# Patient Record
Sex: Male | Born: 1955 | Race: White | Hispanic: No | Marital: Single
Health system: Southern US, Community
[De-identification: ages and names within clinical notes are randomized; demographics above are authoritative.]

---

## 1997-07-27 ENCOUNTER — Other Ambulatory Visit: Admission: RE | Admit: 1997-07-27 | Discharge: 1997-07-27 | Payer: Self-pay | Admitting: *Deleted

## 1998-07-31 ENCOUNTER — Emergency Department (HOSPITAL_COMMUNITY): Admission: EM | Admit: 1998-07-31 | Discharge: 1998-07-31 | Payer: Self-pay | Admitting: Emergency Medicine

## 1998-08-01 ENCOUNTER — Encounter: Payer: Self-pay | Admitting: Emergency Medicine

## 2004-06-04 ENCOUNTER — Emergency Department (HOSPITAL_COMMUNITY): Admission: EM | Admit: 2004-06-04 | Discharge: 2004-06-05 | Payer: Self-pay | Admitting: Emergency Medicine

## 2007-06-29 ENCOUNTER — Ambulatory Visit: Payer: Self-pay | Admitting: Gastroenterology

## 2007-06-29 LAB — CONVERTED CEMR LAB
AST: 18 units/L (ref 0–37)
Alkaline Phosphatase: 61 units/L (ref 39–117)
Basophils Absolute: 0 10*3/uL (ref 0.0–0.1)
Basophils Relative: 0.3 % (ref 0.0–1.0)
Bilirubin, Direct: 0.1 mg/dL (ref 0.0–0.3)
Chloride: 102 meq/L (ref 96–112)
Ferritin: 280 ng/mL (ref 22.0–322.0)
GFR calc Af Amer: 115 mL/min
GFR calc non Af Amer: 95 mL/min
Glucose, Bld: 151 mg/dL — ABNORMAL HIGH (ref 70–99)
Hemoglobin: 16.6 g/dL (ref 13.0–17.0)
Lymphocytes Relative: 23 % (ref 12.0–46.0)
MCHC: 33.8 g/dL (ref 30.0–36.0)
Monocytes Relative: 9.2 % (ref 3.0–12.0)
Neutro Abs: 4.7 10*3/uL (ref 1.4–7.7)
Neutrophils Relative %: 66.2 % (ref 43.0–77.0)
Potassium: 4.2 meq/L (ref 3.5–5.1)
RBC: 5.46 M/uL (ref 4.22–5.81)
Saturation Ratios: 39.8 % (ref 20.0–50.0)
Sodium: 140 meq/L (ref 135–145)
WBC: 7 10*3/uL (ref 4.5–10.5)

## 2007-06-30 ENCOUNTER — Inpatient Hospital Stay (HOSPITAL_COMMUNITY): Admission: EM | Admit: 2007-06-30 | Discharge: 2007-07-03 | Payer: Self-pay | Admitting: Emergency Medicine

## 2007-06-30 ENCOUNTER — Encounter: Payer: Self-pay | Admitting: Gastroenterology

## 2007-06-30 ENCOUNTER — Ambulatory Visit: Payer: Self-pay | Admitting: Gastroenterology

## 2007-07-03 ENCOUNTER — Encounter: Payer: Self-pay | Admitting: Gastroenterology

## 2007-07-07 ENCOUNTER — Ambulatory Visit: Payer: Self-pay | Admitting: Gastroenterology

## 2008-11-06 ENCOUNTER — Encounter (INDEPENDENT_AMBULATORY_CARE_PROVIDER_SITE_OTHER): Payer: Self-pay | Admitting: *Deleted

## 2009-10-16 ENCOUNTER — Ambulatory Visit (HOSPITAL_COMMUNITY): Admission: RE | Admit: 2009-10-16 | Discharge: 2009-10-16 | Payer: Self-pay | Admitting: Internal Medicine

## 2010-07-30 NOTE — H&P (Signed)
Alexander [name], SLIKER NO.:  [id number]   MEDICAL RECORD NO.:  [id number]          PATIENT TYPE:  INP   LOCATION:  1528                         FACILITY:  [facility]   PHYSICIAN:  Malcolm T. Russella Dar, MD, FACGDATE OF BIRTH:  [birth year]   DATE OF ADMISSION:  06/30/2007  DATE OF DISCHARGE:                              HISTORY & PHYSICAL   GASTROINTESTINAL PHYSICIAN:  Dr. Sheryn Bison.   PRIMARY CARE PHYSICIAN:  Dr. Jarome Matin.   CHIEF COMPLAINT:  A 55 year old gentleman with hematemesis, epigastric  and back pain.   HISTORY OF PRESENT ILLNESS:  Alexander [name] is a pleasant 55 year old white  male who has mental retardation.  He has a history of gastroesophageal  reflux disease and had undergone esophageal dilatation back in 1990 by  Dr. Victorino Dike.  His family has noticed recent postprandial coughing,  and it has been getting worse.  He underwent an upper endoscopy with  [facility] dilatation of a Schatzki ring on the afternoon of April 15 by  Dr. Sheryn Bison.  The patient went home in good condition with no  complaints.  He went to sleep for about an 1-1/2 hours to 2 hours, and  his family heard him moaning.  The patient complained of epigastric pain  radiating into his back.  He quickly developed hematemesis of bright red  blood.  There was no chest pain, shortness of breath, fever/chills,  melena or hematochezia.  His last bowel movement had been in the  morning, prior to the endoscopy.  He had not eaten anything from the  time he was made n.p.o., as he had gone straight to bed after coming  home.  His parents brought him to the emergency room, where he was  evaluated by Dr. Claudette Head and admitted for care.  Preliminary  reading on a chest and abdominal series showed no free air or any acute  abnormality. His hemoglobin was 15.4 and his white count was elevated at  24.5.   PAST MEDICAL HISTORY:  1. Hypertension.  2. Diabetes mellitus type 2, controlled with  oral agents.  3. Mental retardation.  4. Gastroesophageal reflux disease with Schatzki ring, dilated in 1990      and this afternoon.  5. Dysphasia manifested as postprandial coughing.  6. Hyperlipidemia.  7. Constipation.  8. History of small colon polyps with latest colonoscopy in 2005.  9. History of kidney stones.  10.Status post remote tonsillectomy.  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   MEDICATIONS:  1. Actos 15 mg daily.  2. Clarinex 5 mg daily.  3. Clorazepate 3.75 mg daily.  4. Lovastatin 40 mg daily.  5. Triamterene 37.5 mg daily.  6. Vitamin C 500 mg daily.  7. Omeprazole 20 mg once daily.   ALLERGIES:  CODEINE, REACTION NOT KNOWN.   SOCIAL HISTORY:  The patient lives in [place] with his  parents.  He does not smoke or chew tobacco, does not consume alcoholic  beverages.   FAMILY HISTORY:  Notable for a brain tumor in his maternal grandmother,  diabetes mellitus type 2 in maternal grandfather.  His  mother has a left  bundle branch block and what sounds like a mitral valve prolapse.   REVIEW OF SYSTEMS:  Has been coughing postprandially, as noted above.  No urinary incontinence, no stool incontinence, no melena.  No sweats or  chills.  No difficulty walking, no falls.  No headaches.  No visual  problems.  No swelling of the extremities.  No rashes, no worrisome  lesions.  No inappropriate bleeding,  no nosebleeds.  No hemoptysis.  The patient's blood sugars at home tend to run anywhere from about 113  to the mid-130s.  He has not had any polydipsia or polyuria.   PHYSICAL EXAM:  GENERAL:  The patient is a well-developed, well-  nourished white male who is in no distress.  VITAL SIGNS:  Blood pressure 100/71, pulse 85, respirations 18,  temperature 98.7, weight is 83.2 kg.  HEENT:  The sclerae are nonicteric.  Conjunctiva is pink.  Extraocular  movements are intact.  Oropharynx is moist and clear.  Dentition in good  repair.  NECK:  Supple without  thyromegaly or adenopathy.  No bruits.  CHEST:  Clear to auscultation and percussion bilaterally.  CARDIOVASCULAR:  There is a regular rate and rhythm.  No murmurs, rubs  or gallops.  ABDOMEN:  Nondistended, soft with some mild diffuse tenderness to deep  palpation across the upper abdomen, but nonfocal.  Bowel sounds are  active.  No bruits.  No hepatosplenomegaly, no masses, no hernias.  EXTREMITIES:  No cyanosis, clubbing or edema.  NEUROLOGIC:  Alert and oriented x3.  Mild mental retardation but with  somewhat difficult to understand speech.  He follows commands easily.  No tremors.  DERMATOLOGIC:  No jaundice.  No pallor.  No spider angiomata.  GU:  Exam:  No scrotal edema.   LABORATORY:  White blood cell count 24.5.  Hemoglobin 15.4, hematocrit  46.2.  MCV 89.6.  Platelets 284.  PT 13.4, INR 1.0, PTT 23.  Sodium 138,  potassium 3.3.  Chloride 101, carbon dioxide 28.  Glucose 197.  BUN 18,  creatinine 1.04.  Total bilirubin 1.6, alkaline phosphatase 57, AST 21,  ALT 24.  Lipase is 17.   RADIOLOGY:  Acute abdominal series with chest film:  There is no acute  cardiopulmonary process, no free intraperitoneal air.  Diffuse mild  gaseous distention of the large and small bowel noted.   IMPRESSION:  1. Acute upper GI bleed with hematemesis and epigastric pain following      EGD with Maloney dilatation.  Suspect tear at the stricture or      gastric tear from the dilator tip or a Mallory Weiss tear.  The      patient currently stable.  2. History of esophageal stricture.  3. Hypokalemia.  4. Diabetes mellitus type 2 with current hyperglycemia.  5. Leukocytosis.  Likely this is reactive to current acute      hematemesis, but will need to be followed up.  Chest x-ray is      negative.   PLAN:  1. The patient admitted to [facility] inpatient GI service for close      monitoring.  At this point, he does not need telemetry.  2. We will plan to follow serial CBCs and transfuse if  indicated.  3. Follow blood sugars.  4. Add potassium to IV fluids.  5. Limit p.o. intake to ice chips and sips of clear liquids.  6. If the patient remains stable and has no recurrence of hematemesis  and blood counts did not the drop precipitously or he does not      become unstable, the patient may not require repeat upper      endoscopy, but if any or all of the above occur, we may need to      pursue upper endoscopy and hemostatic therapy.      Jennye Moccasin, PA-C      Venita Lick. Russella Dar, MD, [facility]  Electronically Signed    SG/MEDQ  D:  07/01/2007  T:  07/01/2007  Job:  [id number]

## 2010-07-30 NOTE — Assessment & Plan Note (Signed)
[facility]                                 ON-CALL NOTE   NAME:[name], Alexander FLAX                         MRN:          [id number]  DATE:06/30/2007                            DOB:          March 29, 1955    Telephone call  Phone number is [phone]  Time is 6:50 p.m.  Physician:  Dr. Sheryn Bison.   Alexander [name] is a 55 year old white male who underwent upper endoscopy with  dilation by Dr. Sheryn Bison this afternoon approximately 3 p.m.  His  mother calls this evening reporting epigastric pain radiating to the  back along with bright red blood emesis on several occasions over the  past hour.  No chest pain, melena, hematochezia, fevers or chills. I  advised the patient's mother to bring the patient to [facility] Emergency Room immediately.  The patient is to remain n.p.o.  The mother agrees and will bring the patient promptly to [facility] Emergency Room for further evaluation.     Venita Lick. Russella Dar, MD, [facility]  Electronically Signed    MTS/MedQ  DD: 06/30/2007  DT: 06/30/2007  Job #: [id number]   cc:   Vania Rea. Jarold Motto, MD, [facility] Essex, FAGA

## 2010-07-30 NOTE — Assessment & Plan Note (Signed)
[facility]                         GASTROENTEROLOGY OFFICE NOTE   NAME:Lakey, WAKE CONLEE                         MRN:          [id number]  DATE:06/29/2007                            DOB:          December 20, 1955    Mr. Bochenek is a 55 year old handicapped male taken care of by his parents.  He is brought in today because of postprandial dysphagia and coughing.  He has a long past history of acid reflux, as been on chronic PPI  therapy.  Last endoscopy was some 13 years ago at which time he had a  Schatzki's ring that was dilated by Dr. Victorino Dike.  He has done well  since that time and denies worsening reflux symptoms, but does over the  last month have increasing coughing postprandially.  I have also seen  him in the past because of chronic constipation and he has had some  small colon polyps removed; last colonoscopy in 2005.  The patient  apparently had an episode of abdominal pain in March of 2006, had CT  scan of the abdomen which was entirely unremarkable except for large  amount of fecal material in his colon.  He is followed by Dr. Jarome Matin and has adult-onset diabetes, hypertension, and hyperlipidemia.   The family relates that Demarr eats well and most of his coughing occurs  if he over eats.  He has had no anorexia, weight loss, nausea, vomiting,  melena, hematochezia, specific hepatobiliary complaints.   His diabetes is well-controlled and recent hemoglobin A1C was checked by  Dr. Jarome Matin and was 6.5%.  I do not have other recent laboratory  data.   Patient also has hypertension, hyperlipidemia and has no history of MIs  or CVAs.  From what I can ascertain, he has not had previous abdominal  surgery.  He does have recurrent kidney stones, had a previous  tonsillectomy.   FAMILY HISTORY:  Noncontributory.   MEDICATIONS:  1. Triamterene/hydrochlorothiazide 37.5 - 25 mg a day.  2. Clorazepate 3.7 mg daily.  3. Omeprazole 20 mg a  day.  4. Actos 15 mg a day.  5. Lovastatin 40 mg a day.  6. Vitamin C daily.   He denies true drug allergies.   SOCIAL HISTORY:  He is single, lives with his parents and has apparently  a 10th grade education but is mentally retarded.  Does not smoke or use  ethanol.   REVIEW OF SYSTEMS:  Otherwise noncontributory.   PHYSICAL EXAMINATION:  GENERAL:  He is awake and alert in no acute  distress.  VITAL SIGNS: His height is 5 feet 8 inches tall and weighs 188 pounds.  Blood pressure is 120/76 and pulse was 80 and regular.  I could not  appreciate stigmata of chronic liver disease or thyromegaly.  CHEST:  Clear.  CARDIAC:  Regular rhythm without murmurs, gallops or rubs.  ABDOMEN:  No distention, organomegaly, masses or tenderness.  Bowel  sounds were nonobstructive.  EXTREMITIES:  Upper extremities were unremarkable.  Patient was oriented but was not very conversive.  Full mental status  was not performed.  ASSESSMENT:  1. Chronic gastroesophageal reflux disease with a history of      Schatzki's ring to the esophagus.  Reviewing Dr. Blossom Hoops note      from years ago, this current episode sounds almost exactly like      that clinic visit when he had his Schatzki's ring dilated.  2. Chronic functional constipation.  3. Well-controlled adult-onset diabetes.  4. Well-controlled essential hypertension, hyperlipidemia.  5. Mild mental retardation.   RECOMMENDATION:  1. Reflux regime reviewed with parents and will change to Nexium 40 mg      before breakfast and supper.  2. Outpatient endoscopy of esophageal dilatation.  3. Consider other multiple medication per Dr. Jarome Matin.   Biopsy of the patient's rectum in 2005 actually showed mucosal prolapse  syndrome and no adenomatous polyps.     Vania Rea. Jarold Motto, MD, [facility], FAGA  Electronically Signed    DRP/MedQ  DD: 06/29/2007  DT: 06/29/2007  Job #: [id number]   cc:   Barry Dienes. Eloise Harman, M.D.

## 2010-08-02 NOTE — Discharge Summary (Signed)
NAMESHAVAR, GORKA NO.:  [id number]   MEDICAL RECORD NO.:  [id number]          PATIENT TYPE:  INP   LOCATION:  1528                         FACILITY:  [facility]   PHYSICIAN:  Hedwig Morton. Juanda Chance, MD     DATE OF BIRTH:  10-01-1955   DATE OF ADMISSION:  06/30/2007  DATE OF DISCHARGE:  07/03/2007                               DISCHARGE SUMMARY   ADMITTING DIAGNOSES:  46. A 55 year old male with hematemesis and epigastric pain, status      post EGD and Maloney dilation, suspect esophageal tear or gastric      tear post dilation.  2. History of esophageal stricture.  3. Hypokalemia.  4. Adult-onset diabetes mellitus.  5. Leukocytosis likely secondary to #1.  6. Mental retardation.   DISCHARGE DIAGNOSES:  1. Stable status post upper gastrointestinal bleed secondary to      esophageal dilation with no evidence for tear or leak on upper GI,      no transfusion requirement.  2. Other diagnoses as listed above.   CONSULTATIONS:  None.   PROCEDURES:  Upper gastrointestinal and plain abdominal films.   BRIEF HISTORY:  Christina is a 55 year old white male with history of mental  retardation.  He has a history of GERD and has undergone previous  esophageal dilations by Dr. Terrial Rhodes.  Recently he had been noted  to have some postprandial coughing which had been progressing.  He  underwent EGD with Elease Hashimoto dilation of a Schatzki's ring on June 30, 2007 per Dr. Sheryn Bison.  He did well with the procedure and was  discharged without complaints.  When he awakened apparently he was  moaning.  The patient complained of some epigastric pain radiating into  his back, and then vomited some bright red blood.  He had no complaints  of chest pain or shortness of breath.  He had not eaten anything from  the time that he had had the procedure.  He was brought to the emergency  room by his parents, evaluated by Dr. Russella Dar, and admitted for  stabilization and further diagnostic  evaluation.  There was no evidence  of free air.  On plain abdominal films hemoglobin was 15.4 at the time  of presentation.   LABORATORY STUDIES:  On April 15 it showed a white count of 24.5,  hemoglobin 15.4, hematocrit of 46.2, MCV of 89, and platelets 284.  Serial values were obtained and on the 16th WBC was 12.4, hemoglobin  12.6, hematocrit of 37.3.  On the 18th WBC of 11.9, hemoglobin 11.7,  hematocrit of 34.5.  ProTime 13.4, INR of 1.0, PTT of 23, potassium was  3.3 on admission, this was corrected.  BUN 18, creatinine 1.04 on  admission.  Liver function studies normal with the exception of a total  bilirubin at 1.6.   X-RAY STUDIES:  Chest x-ray on 04/16 was negative.  Upper GI on 04/17 as  outlined above.  No acute findings, somewhat patulous esophagus,  specifically no evidence for stricture or leak   HOSPITAL COURSE:  The patient was admitted to the  service of Dr. Claudette Head who was covering the hospital.  He was initially placed on IV  fluids, kept n.p.o., started on a Protonix infusion, and serial H&H.  He  was followed then by Dr. Lina Sar, remained hemodynamically stable,  did not require any transfusions, and did not manifest any further  evidence of active bleeding.  He did have a low-grade temperature on the  16th.  Chest x-ray was obtained and this was negative.  He underwent  upper GI on the 17th which was also a negative exam specifically without  evidence of leak or stricture.  His diet was advanced, and he was  allowed discharge to home on 04/18 with instructions to follow up with  Dr. Jarold Motto, in the office, and to call if any problems in the  interim.   MEDICATIONS:  He was to continue his:  1. Actos 15 mg daily.  2. Clarinex 5 mg as needed.  3. Clorazepate 3.75 daily  4. Lovastatin 40 daily.  5. Triamterene 37.5 daily.  6. Omeprazole 20 mg daily.      Amy Armour, PA-C      Dora M. Juanda Chance, MD  Electronically Signed    AE/MEDQ  D:   07/26/2007  T:  07/26/2007  Job:  [id number]   cc:   Vania Rea. Jarold Motto, MD, FACG, FACP, FAGA  [name]  [place]  [place] [address]

## 2010-12-10 LAB — CBC
HCT: 34.5 — ABNORMAL LOW
HCT: 37.3 — ABNORMAL LOW
HCT: 46.2
Hemoglobin: 12.6 — ABNORMAL LOW
Hemoglobin: 15.4
MCHC: 33.4
MCHC: 33.5
MCHC: 33.9
MCV: 88.7
MCV: 89.6
Platelets: 204
Platelets: 216
Platelets: 284
RBC: 5.15
RDW: 13
RDW: 13
RDW: 13.1
RDW: 13.1
WBC: 24.5 — ABNORMAL HIGH

## 2010-12-10 LAB — DIFFERENTIAL
Basophils Absolute: 0.2 — ABNORMAL HIGH
Basophils Relative: 1
Eosinophils Absolute: 0
Eosinophils Relative: 0
Lymphocytes Relative: 10 — ABNORMAL LOW
Lymphs Abs: 2.5
Monocytes Absolute: 1.4 — ABNORMAL HIGH
Monocytes Relative: 6
Neutro Abs: 20.4 — ABNORMAL HIGH
Neutrophils Relative %: 83 — ABNORMAL HIGH

## 2010-12-10 LAB — COMPREHENSIVE METABOLIC PANEL
ALT: 18
ALT: 24
AST: 18
AST: 21
Alkaline Phosphatase: 57
CO2: 28
Calcium: 7.9 — ABNORMAL LOW
Chloride: 101
GFR calc Af Amer: >60
GFR calc Af Amer: >60
GFR calc non Af Amer: >60
Potassium: 3.3 — ABNORMAL LOW
Sodium: 138
Sodium: 138
Total Bilirubin: 1.6 — ABNORMAL HIGH
Total Protein: 5.5 — ABNORMAL LOW

## 2010-12-10 LAB — LIPASE, BLOOD: Lipase: 17

## 2010-12-10 LAB — COMPREHENSIVE METABOLIC PANEL WITH GFR
Albumin: 3.8
BUN: 18
Calcium: 8.7
Creatinine, Ser: 1.04
Glucose, Bld: 197 — ABNORMAL HIGH
Total Protein: 6.7

## 2010-12-10 LAB — CROSSMATCH: Antibody Screen: NEGATIVE

## 2010-12-10 LAB — SAMPLE TO BLOOD BANK

## 2010-12-10 LAB — HEMOGLOBIN AND HEMATOCRIT, BLOOD: Hemoglobin: 12.1 — ABNORMAL LOW

## 2010-12-10 LAB — PROTIME-INR
INR: 1
Prothrombin Time: 13.4

## 2010-12-10 LAB — APTT: aPTT: 23 — ABNORMAL LOW

## 2012-07-01 ENCOUNTER — Other Ambulatory Visit: Payer: Self-pay | Admitting: Dermatology

## 2012-07-07 ENCOUNTER — Other Ambulatory Visit: Payer: Self-pay | Admitting: Dermatology

## 2012-11-04 ENCOUNTER — Other Ambulatory Visit: Payer: Self-pay | Admitting: Gastroenterology

## 2012-11-05 NOTE — Telephone Encounter (Signed)
Patient has wrong doctor

## 2013-10-14 ENCOUNTER — Encounter: Payer: Self-pay | Admitting: Gastroenterology

## 2014-06-12 ENCOUNTER — Encounter: Payer: Self-pay | Admitting: Gastroenterology

## 2014-07-21 ENCOUNTER — Encounter: Payer: Self-pay | Admitting: Gastroenterology

## 2019-12-13 ENCOUNTER — Ambulatory Visit (HOSPITAL_COMMUNITY)
Admission: EM | Admit: 2019-12-13 | Discharge: 2019-12-13 | Disposition: A | Discharge status: Home / Self Care | Payer: Medicare Other

## 2019-12-13 ENCOUNTER — Other Ambulatory Visit: Payer: Self-pay

## 2019-12-13 NOTE — ED Notes (Signed)
Patient is being discharged from the Urgent Care and sent to the Emergency Department via private vehicle . Per Patterson Hammersmith, PA, patient is in need of higher level of care due to open fracture / wound requiring suturing. Patient is aware and verbalizes understanding of plan of care. There were no vitals filed for this visit.

## 2019-12-13 NOTE — ED Notes (Signed)
Have paperwork at nurses desk.  Called [facility].  Someone at practice had told patient that they did not have a provider to sew wound.  Patient was sent to [place] urgent care to be seen for suturing

## 2019-12-13 NOTE — ED Notes (Signed)
Spoke to patient and family member. Spoke to [employer], pa about patient.  Patient to go to ED for evaluation.

## 2020-05-03 ENCOUNTER — Emergency Department (HOSPITAL_COMMUNITY): Payer: Medicare Other

## 2020-05-03 ENCOUNTER — Other Ambulatory Visit: Payer: Self-pay

## 2020-05-03 ENCOUNTER — Emergency Department (HOSPITAL_COMMUNITY)
Admission: EM | Admit: 2020-05-03 | Discharge: 2020-05-04 | Disposition: A | Discharge status: Home / Self Care | Payer: Medicare Other | Attending: Emergency Medicine | Admitting: Emergency Medicine

## 2020-05-03 ENCOUNTER — Encounter (HOSPITAL_COMMUNITY): Payer: Self-pay | Admitting: Emergency Medicine

## 2020-05-03 DIAGNOSIS — S0990XA Unspecified injury of head, initial encounter: Secondary | ICD-10-CM | POA: Diagnosis present

## 2020-05-03 DIAGNOSIS — S0003XA Contusion of scalp, initial encounter: Secondary | ICD-10-CM | POA: Insufficient documentation

## 2020-05-03 DIAGNOSIS — W108XXA Fall (on) (from) other stairs and steps, initial encounter: Secondary | ICD-10-CM | POA: Diagnosis not present

## 2020-05-03 DIAGNOSIS — Z23 Encounter for immunization: Secondary | ICD-10-CM | POA: Insufficient documentation

## 2020-05-03 DIAGNOSIS — R1084 Generalized abdominal pain: Secondary | ICD-10-CM | POA: Insufficient documentation

## 2020-05-03 DIAGNOSIS — W19XXXA Unspecified fall, initial encounter: Secondary | ICD-10-CM

## 2020-05-03 LAB — COMPREHENSIVE METABOLIC PANEL
ALT: 15 U/L (ref 0–44)
AST: 22 U/L (ref 15–41)
Albumin: 3.8 g/dL (ref 3.5–5.0)
Alkaline Phosphatase: 53 U/L (ref 38–126)
Anion gap: 11 (ref 5–15)
BUN: 13 mg/dL (ref 8–23)
CO2: 24 mmol/L (ref 22–32)
Calcium: 8.9 mg/dL (ref 8.9–10.3)
Chloride: 101 mmol/L (ref 98–111)
Creatinine, Ser: 1.01 mg/dL (ref 0.61–1.24)
GFR, Estimated: >60 mL/min (ref >60)
Glucose, Bld: 174 mg/dL — ABNORMAL HIGH (ref 70–99)
Potassium: 3.4 mmol/L — ABNORMAL LOW (ref 3.5–5.1)
Sodium: 136 mmol/L (ref 135–145)
Total Bilirubin: 1 mg/dL (ref 0.3–1.2)
Total Protein: 7 g/dL (ref 6.5–8.1)

## 2020-05-03 LAB — CBC WITH DIFFERENTIAL/PLATELET
Abs Immature Granulocytes: 0.05 10*3/uL (ref 0.00–0.07)
Basophils Absolute: 0 10*3/uL (ref 0.0–0.1)
Basophils Relative: 0 %
Eosinophils Absolute: 0 10*3/uL (ref 0.0–0.5)
Eosinophils Relative: 0 %
HCT: 45.3 % (ref 39.0–52.0)
Hemoglobin: 14.9 g/dL (ref 13.0–17.0)
Immature Granulocytes: 1 %
Lymphocytes Relative: 9 %
Lymphs Abs: 0.9 10*3/uL (ref 0.7–4.0)
MCH: 30.1 pg (ref 26.0–34.0)
MCHC: 32.9 g/dL (ref 30.0–36.0)
MCV: 91.5 fL (ref 80.0–100.0)
Monocytes Absolute: 0.5 10*3/uL (ref 0.1–1.0)
Monocytes Relative: 5 %
Neutro Abs: 9 10*3/uL — ABNORMAL HIGH (ref 1.7–7.7)
Neutrophils Relative %: 85 %
Platelets: 216 10*3/uL (ref 150–400)
RBC: 4.95 MIL/uL (ref 4.22–5.81)
RDW: 12.8 % (ref 11.5–15.5)
WBC: 10.5 10*3/uL (ref 4.0–10.5)
nRBC: 0 % (ref 0.0–0.2)

## 2020-05-03 LAB — LIPASE, BLOOD: Lipase: 23 U/L (ref 11–51)

## 2020-05-03 MED ORDER — IOHEXOL 300 MG/ML  SOLN
100.0000 mL | Freq: Once | INTRAMUSCULAR | Status: AC | PRN
  Administered 2020-05-03: 100 mL via INTRAVENOUS

## 2020-05-03 MED ORDER — TETANUS-DIPHTH-ACELL PERTUSSIS 5-2.5-18.5 LF-MCG/0.5 IM SUSY
0.5000 mL | PREFILLED_SYRINGE | Freq: Once | INTRAMUSCULAR | Status: AC
  Administered 2020-05-03: 0.5 mL via INTRAMUSCULAR
  Filled 2020-05-03: qty 0.5

## 2020-05-03 NOTE — ED Triage Notes (Addendum)
Patient tripped and fell down thirteen steps. No LOC. Hematoma to left forehead. Multiple abrasions on face and chest. Denies neck pain, dizziness.

## 2020-05-03 NOTE — ED Notes (Signed)
Patient transported to CT 

## 2020-05-03 NOTE — ED Provider Notes (Signed)
Care assumed from [facility], please see her note for full details, but in brief Alexander [name] is a 65 y.o. male presents after he fell backwards down a flight of stairs hitting his head, he has a hematoma to the head but no other obvious signs of trauma.  No loss of consciousness or focal neuro deficits.  Lab work, CT of the head and cervical spine and x-rays of the chest and pelvis have all been reassuring but patient did have some abdominal tenderness, CT abdomen pelvis pending, no significant tenderness over the chest, did not feel that CT chest was warranted.  Plan: f/u CT, if normal can go home  BP 132/78   Pulse 72   Temp 98.2 F (36.8 C) (Oral)   Resp 18   SpO2 100%   ED Course/Procedures   Labs Reviewed  COMPREHENSIVE METABOLIC PANEL - Abnormal; Notable for the following components:      Result Value   Potassium 3.4 (*)    Glucose, Bld 174 (*)    All other components within normal limits  CBC WITH DIFFERENTIAL/PLATELET - Abnormal; Notable for the following components:   Neutro Abs 9.0 (*)    All other components within normal limits  LIPASE, BLOOD   CT Head Wo Contrast  Result Date: 05/03/2020 CLINICAL DATA:  Fall down stairs EXAM: CT HEAD WITHOUT CONTRAST CT CERVICAL SPINE WITHOUT CONTRAST TECHNIQUE: Multidetector CT imaging of the head and cervical spine was performed following the standard protocol without intravenous contrast. Multiplanar CT image reconstructions of the cervical spine were also generated. COMPARISON:  None. FINDINGS: CT HEAD FINDINGS Brain: There is no mass, hemorrhage or extra-axial collection. The size and configuration of the ventricles and extra-axial CSF spaces are normal. The brain parenchyma is normal, without evidence of acute or chronic infarction. Vascular: No abnormal hyperdensity of the major intracranial arteries or dural venous sinuses. No intracranial atherosclerosis. Skull: Left frontal scalp hematoma.  No skull fracture. Sinuses/Orbits:  Right frontal sinus osteoma.  The orbits are normal. CT CERVICAL SPINE FINDINGS Alignment: No static subluxation. Facets are aligned. Occipital condyles are normally positioned. Skull base and vertebrae: No acute fracture. Soft tissues and spinal canal: No prevertebral fluid or swelling. No visible canal hematoma. Disc levels: Multilevel cervical degenerative disc disease with some degree of spinal canal narrowing at C4-5, C5-6 and T1-2. Upper chest: No pneumothorax, pulmonary nodule or pleural effusion. Other: Normal visualized paraspinal cervical soft tissues. IMPRESSION: 1. No acute intracranial abnormality. 2. Left frontal scalp hematoma without skull fracture. 3. No acute fracture or static subluxation of the cervical spine. Electronically Signed   By: Deatra Robinson M.D.   On: 05/03/2020 22:12   CT Cervical Spine Wo Contrast  Result Date: 05/03/2020 CLINICAL DATA:  Fall down stairs EXAM: CT HEAD WITHOUT CONTRAST CT CERVICAL SPINE WITHOUT CONTRAST TECHNIQUE: Multidetector CT imaging of the head and cervical spine was performed following the standard protocol without intravenous contrast. Multiplanar CT image reconstructions of the cervical spine were also generated. COMPARISON:  None. FINDINGS: CT HEAD FINDINGS Brain: There is no mass, hemorrhage or extra-axial collection. The size and configuration of the ventricles and extra-axial CSF spaces are normal. The brain parenchyma is normal, without evidence of acute or chronic infarction. Vascular: No abnormal hyperdensity of the major intracranial arteries or dural venous sinuses. No intracranial atherosclerosis. Skull: Left frontal scalp hematoma.  No skull fracture. Sinuses/Orbits: Right frontal sinus osteoma.  The orbits are normal. CT CERVICAL SPINE FINDINGS Alignment: No static subluxation. Facets  are aligned. Occipital condyles are normally positioned. Skull base and vertebrae: No acute fracture. Soft tissues and spinal canal: No prevertebral fluid or  swelling. No visible canal hematoma. Disc levels: Multilevel cervical degenerative disc disease with some degree of spinal canal narrowing at C4-5, C5-6 and T1-2. Upper chest: No pneumothorax, pulmonary nodule or pleural effusion. Other: Normal visualized paraspinal cervical soft tissues. IMPRESSION: 1. No acute intracranial abnormality. 2. Left frontal scalp hematoma without skull fracture. 3. No acute fracture or static subluxation of the cervical spine. Electronically Signed   By: Deatra Robinson M.D.   On: 05/03/2020 22:12   CT Abdomen Pelvis W Contrast  Result Date: 05/03/2020 CLINICAL DATA:  Abdominal pain, fell downstairs, multiple abrasions EXAM: CT ABDOMEN AND PELVIS WITH CONTRAST TECHNIQUE: Multidetector CT imaging of the abdomen and pelvis was performed using the standard protocol following bolus administration of intravenous contrast. CONTRAST:  OMNIPAQUE IOHEXOL 300 MG/ML  SOLN COMPARISON:  06/05/2004 FINDINGS: Lower chest: No acute pleural or parenchymal lung disease. Hepatobiliary: No hepatic injury or perihepatic hematoma. Gallbladder is unremarkable. Small hepatic cysts are unchanged. Pancreas: Unremarkable. No pancreatic ductal dilatation or surrounding inflammatory changes. Spleen: No splenic injury or perisplenic hematoma. Adrenals/Urinary Tract: Adrenal glands are unremarkable. Kidneys are normal, without renal calculi, focal lesion, or hydronephrosis. Bladder is unremarkable. Stomach/Bowel: No bowel obstruction or ileus. Normal appendix right lower quadrant. No bowel wall thickening or inflammatory change. Vascular/Lymphatic: Aortic atherosclerosis. No enlarged abdominal or pelvic lymph nodes. Reproductive: Prostate is unremarkable. Other: No free fluid or free gas.  No abdominal wall hernia. Musculoskeletal: No acute or destructive bony lesions. Reconstructed images demonstrate no additional findings. IMPRESSION: 1. No acute intra-abdominal or intrapelvic process. Electronically Signed    By: Sharlet Salina M.D.   On: 05/03/2020 23:59   DG Pelvis Portable  Result Date: 05/03/2020 CLINICAL DATA:  Bilateral hip pain after fall EXAM: PORTABLE PELVIS 1-2 VIEWS COMPARISON:  None. FINDINGS: Single frontal view of the pelvis including both hips was performed. No acute displaced fracture. Joint spaces are well preserved. Sacroiliac joints are normal. IMPRESSION: 1. Unremarkable pelvis and bilateral hips. Electronically Signed   By: Sharlet Salina M.D.   On: 05/03/2020 21:58   DG Chest Portable 1 View  Result Date: 05/03/2020 CLINICAL DATA:  Bilateral rib pain after fall EXAM: PORTABLE CHEST 1 VIEW COMPARISON:  07/01/2007 FINDINGS: Single frontal view of the chest demonstrates an unremarkable cardiac silhouette. No airspace disease, effusion, or pneumothorax. No acute displaced fracture. IMPRESSION: 1. No acute intrathoracic process. Electronically Signed   By: Sharlet Salina M.D.   On: 05/03/2020 21:57     Procedures  MDM   CT abdomen pelvis reassuring with no acute intra-abdominal or intrapelvic process.  Patient's work-up has been very reassuring and he is stable for discharge home I discussed this plan with patient and his father at bedside they expressed understanding and agreement and are eager to get home.  Discharged home in good condition.       Dartha Lodge, PA-C 05/04/20 0030    Zadie Rhine, MD 05/04/20 [phone]

## 2020-05-03 NOTE — ED Provider Notes (Signed)
[facility] Note   CSN: [id number] Arrival date & time: 05/03/20  1734     History Chief Complaint  Patient presents with  . Fall    Alexander PeachJames K Schley is a 65 y.o. male who presents to the ED today s/p fall down 13 stairs that occurred around 4 PM today. Father is with pt at bedside - reports pt was at the top of the stairs where a door is; he believes pt likely tripped and lost his footing at the top of the stairs causing him to fall backwards down the entire 13 steps hitting his head at the bottom of the stairs on the floor; no LOC. Dad heard the commotion and went to find patient at the bottom of the staircase. Pt was able to stand afterwards and came by POV. Father does not believe pt is anticoagulated. Pt denies nausea, vomiting, blurry vision, or double vision. Dad states pt is acting at baseline. Pt also currently complaining of abdominal pain and bilateral rib pain/bilateral hip pain. He been able to ambulate without difficulty. No other complaints at this time.   The history is provided by the patient, medical records and a parent. The history is limited by a developmental delay.      History reviewed. No pertinent past medical history.  There are no problems to display for this patient.  No family history on file.     Home Medications Prior to Admission medications   Medication Sig Start Date End Date Taking? Authorizing Provider  atenolol (TENORMIN) 25 MG tablet Take 25 mg by mouth daily. 04/07/20  Yes [provider]  lovastatin (MEVACOR) 40 MG tablet Take 40 mg by mouth daily.   Yes [provider]  omeprazole (PRILOSEC) 40 MG capsule Take 40 mg by mouth daily.   Yes [provider]  pioglitazone (ACTOS) 15 MG tablet Take 15 mg by mouth daily. 04/09/20  Yes [provider]  triamterene-hydrochlorothiazide (MAXZIDE-25) 37.5-25 MG tablet Take 1 tablet by mouth daily.   Yes [provider]    Allergies    Patient has no known allergies.  Review of Systems   Review of Systems  Constitutional: Negative for chills and fever.  Gastrointestinal: Positive for abdominal pain. Negative for nausea and vomiting.  Musculoskeletal: Positive for arthralgias.  Neurological: Positive for headaches. Negative for syncope.  All other systems reviewed and are negative.   Physical Exam Updated Vital Signs BP (!) 141/77   Pulse 79   Temp 98.2 F (36.8 C) (Oral)   Resp 17   SpO2 100%   Physical Exam Vitals and nursing note reviewed.  Constitutional:      Appearance: He is not ill-appearing.  HENT:     Head: Normocephalic.     Comments: Large hematoma to left frontal area with abrasion with small amount of active bleeding Eyes:     Extraocular Movements: Extraocular movements intact.     Conjunctiva/sclera: Conjunctivae normal.     Pupils: Pupils are equal, round, and reactive to light.  Neck:     Comments: Mild C midline spinal TTP with surrounding right paracervical musculature TTP; small abrasion noted to posterior right neck without hematoma appreciated Cardiovascular:     Rate and Rhythm: Normal rate and regular rhythm.  Pulmonary:     Effort: Pulmonary effort is normal.     Breath sounds: Normal breath sounds. No wheezing, rhonchi or rales.     Comments: + lateral rib TTP bilaterally  without crepitus. Speaking in full sentences without difficulty. Satting 100% on RA. Chest:     Chest wall: Tenderness present.  Abdominal:     Palpations: Abdomen is soft.     Tenderness: There is abdominal tenderness.     Comments: Abrasion noted to epigastrium; no obvious TTP Diffuse abdominal TTP on exam; bowel sounds equal; no rebound or guarding  Musculoskeletal:     Cervical back: Neck supple.     Comments: No T or L midline spinal TTP  Pelvis is stable however pt complains of bilateral hip pain with palpation. No TTP with hip flexion or log roll of bilateral legs. Strength  5/5 with hip flexion against gravity. No tenderness to remainder of BLEs. 2+ DP pulses.   Skin:    General: Skin is warm and dry.  Neurological:     Mental Status: He is alert.     ED Results / Procedures / Treatments   Labs (all labs ordered are listed, but only abnormal results are displayed) Labs Reviewed  COMPREHENSIVE METABOLIC PANEL - Abnormal; Notable for the following components:      Result Value   Potassium 3.4 (*)    Glucose, Bld 174 (*)    All other components within normal limits  CBC WITH DIFFERENTIAL/PLATELET - Abnormal; Notable for the following components:   Neutro Abs 9.0 (*)    All other components within normal limits  LIPASE, BLOOD    EKG None  Radiology CT Head Wo Contrast  Result Date: 05/03/2020 CLINICAL DATA:  Fall down stairs EXAM: CT HEAD WITHOUT CONTRAST CT CERVICAL SPINE WITHOUT CONTRAST TECHNIQUE: Multidetector CT imaging of the head and cervical spine was performed following the standard protocol without intravenous contrast. Multiplanar CT image reconstructions of the cervical spine were also generated. COMPARISON:  None. FINDINGS: CT HEAD FINDINGS Brain: There is no mass, hemorrhage or extra-axial collection. The size and configuration of the ventricles and extra-axial CSF spaces are normal. The brain parenchyma is normal, without evidence of acute or chronic infarction. Vascular: No abnormal hyperdensity of the major intracranial arteries or dural venous sinuses. No intracranial atherosclerosis. Skull: Left frontal scalp hematoma.  No skull fracture. Sinuses/Orbits: Right frontal sinus osteoma.  The orbits are normal. CT CERVICAL SPINE FINDINGS Alignment: No static subluxation. Facets are aligned. Occipital condyles are normally positioned. Skull base and vertebrae: No acute fracture. Soft tissues and spinal canal: No prevertebral fluid or swelling. No visible canal hematoma. Disc levels: Multilevel cervical degenerative disc disease with some degree of  spinal canal narrowing at C4-5, C5-6 and T1-2. Upper chest: No pneumothorax, pulmonary nodule or pleural effusion. Other: Normal visualized paraspinal cervical soft tissues. IMPRESSION: 1. No acute intracranial abnormality. 2. Left frontal scalp hematoma without skull fracture. 3. No acute fracture or static subluxation of the cervical spine. Electronically Signed   By: Deatra Robinson M.D.   On: 05/03/2020 22:12   CT Cervical Spine Wo Contrast  Result Date: 05/03/2020 CLINICAL DATA:  Fall down stairs EXAM: CT HEAD WITHOUT CONTRAST CT CERVICAL SPINE WITHOUT CONTRAST TECHNIQUE: Multidetector CT imaging of the head and cervical spine was performed following the standard protocol without intravenous contrast. Multiplanar CT image reconstructions of the cervical spine were also generated. COMPARISON:  None. FINDINGS: CT HEAD FINDINGS Brain: There is no mass, hemorrhage or extra-axial collection. The size and configuration of the ventricles and extra-axial CSF spaces are normal. The brain parenchyma is normal, without evidence of acute or chronic infarction. Vascular: No abnormal hyperdensity of the major intracranial arteries  or dural venous sinuses. No intracranial atherosclerosis. Skull: Left frontal scalp hematoma.  No skull fracture. Sinuses/Orbits: Right frontal sinus osteoma.  The orbits are normal. CT CERVICAL SPINE FINDINGS Alignment: No static subluxation. Facets are aligned. Occipital condyles are normally positioned. Skull base and vertebrae: No acute fracture. Soft tissues and spinal canal: No prevertebral fluid or swelling. No visible canal hematoma. Disc levels: Multilevel cervical degenerative disc disease with some degree of spinal canal narrowing at C4-5, C5-6 and T1-2. Upper chest: No pneumothorax, pulmonary nodule or pleural effusion. Other: Normal visualized paraspinal cervical soft tissues. IMPRESSION: 1. No acute intracranial abnormality. 2. Left frontal scalp hematoma without skull fracture. 3.  No acute fracture or static subluxation of the cervical spine. Electronically Signed   By: Deatra Robinson M.D.   On: 05/03/2020 22:12   DG Pelvis Portable  Result Date: 05/03/2020 CLINICAL DATA:  Bilateral hip pain after fall EXAM: PORTABLE PELVIS 1-2 VIEWS COMPARISON:  None. FINDINGS: Single frontal view of the pelvis including both hips was performed. No acute displaced fracture. Joint spaces are well preserved. Sacroiliac joints are normal. IMPRESSION: 1. Unremarkable pelvis and bilateral hips. Electronically Signed   By: Sharlet Salina M.D.   On: 05/03/2020 21:58   DG Chest Portable 1 View  Result Date: 05/03/2020 CLINICAL DATA:  Bilateral rib pain after fall EXAM: PORTABLE CHEST 1 VIEW COMPARISON:  07/01/2007 FINDINGS: Single frontal view of the chest demonstrates an unremarkable cardiac silhouette. No airspace disease, effusion, or pneumothorax. No acute displaced fracture. IMPRESSION: 1. No acute intrathoracic process. Electronically Signed   By: Sharlet Salina M.D.   On: 05/03/2020 21:57    Procedures Procedures   Medications Ordered in ED Medications  Tdap (BOOSTRIX) injection 0.5 mL (0.5 mLs Intramuscular Given 05/03/20 2228)    ED Course  I have reviewed the triage vital signs and the nursing notes.  Pertinent labs & imaging results that were available during my care of the patient were reviewed by me and considered in my medical decision making (see chart for details).    MDM Rules/Calculators/A&P                          65 year old male with a history of intellectual disability who presents to the ED today with his father with complaints of falling down 13 steps in his home earlier today with positive head injury, no loss of consciousness. Patient was able to ambulate a presented by personal vehicle. On arrival to the ED vitals are stable. Patient is afebrile, nontachycardic and nontachypneic. Triage report patient was noted to have a hematoma to his left forehead however no  imaging was obtained. Patient was brought back to her room he has very large hematoma to left frontal scalp with surrounding tenderness palpation. He does have dried blood to this area and after cleaning wound does appear to have a skin abrasion/avulsion with some amount of active bleeding; dressing applied. Dad reports that patient is at baseline, he is following all commands without difficulty. He is neurovascularly intact throughout. He does have some mild midline C-spine tenderness palpation. Patient also complaining of abdominal pain. He has a small abrasion to his epigastric area however he has diffuse abdominal tenderness palpation. Patient is able to adequately tell me where it hurts and where it does not hurt. Will work-up for abdominal pain at this time with labs as well as imaging of his abdomen with fall from 13 steps. We will plan for CT head  and CT C-spine as well. Patient to be taken over to CT scanner prior to lab work for CT head. Will have to return for CT abdomen and pelvis. Other believes that patient's tetanus is up-to-date, unable to see this in our system. We will plan to update here today.  CT head and CT C spine negative today. Will await abdominal labs prior to CT A/P. Xrays negative today. If no acute findings will plan to discharge pt home.   CBC without leukocytosis. Hgb stable at 14.9 CMP with glucose 174, potassium 3.4. No other electrolyte abnormalities.  Lipase 23.   Pending CT scan at this time. Case signed out to oncoming team, Jodi Geralds, PA-C, who will dispo patient accordingly if CT scan negative.   This note was prepared using Dragon voice recognition software and may include unintentional dictation errors due to the inherent limitations of voice recognition software.  Final Clinical Impression(s) / ED Diagnoses Final diagnoses:  Fall, initial encounter  Injury of head, initial encounter  Hematoma of scalp, initial encounter  Generalized abdominal pain     Rx / DC Orders ED Discharge Orders    None       Tanda Rockers, PA-C 05/03/20 2332    Pollyann Savoy, MD 05/08/20 1452

## 2020-05-03 NOTE — Discharge Instructions (Signed)
Your workup was reassuring today. Your scans did not show any acute findings. I would recommend taking Tylenol as needed for pain and to follow up with your PCP regarding your ED visit today.   Please apply neosporin to the head wound and keep wound clean/dry. You can apply ice to the hematoma on your scalp to help with pain/inflammation.   Return to the ED IMMEDIATELY for any worsening symptoms including worsening pain, new onset confusion, excessive vomiting, blurry vision/double vision, or any other new/concerning symptoms

## 2020-05-04 NOTE — ED Notes (Signed)
E-signature pad unavailable at time of pt discharge. This RN discussed discharge materials with pt and answered all pt questions. Pt stated understanding of discharge material. ? ?

## 2021-06-11 IMAGING — DX DG CHEST 1V PORT
1 series · 1 of 1 positions shown · non-contrast
Comparison: 07/01/2007

CLINICAL DATA: Bilateral rib pain after fall

EXAM:
PORTABLE CHEST 1 VIEW

[chest ap]
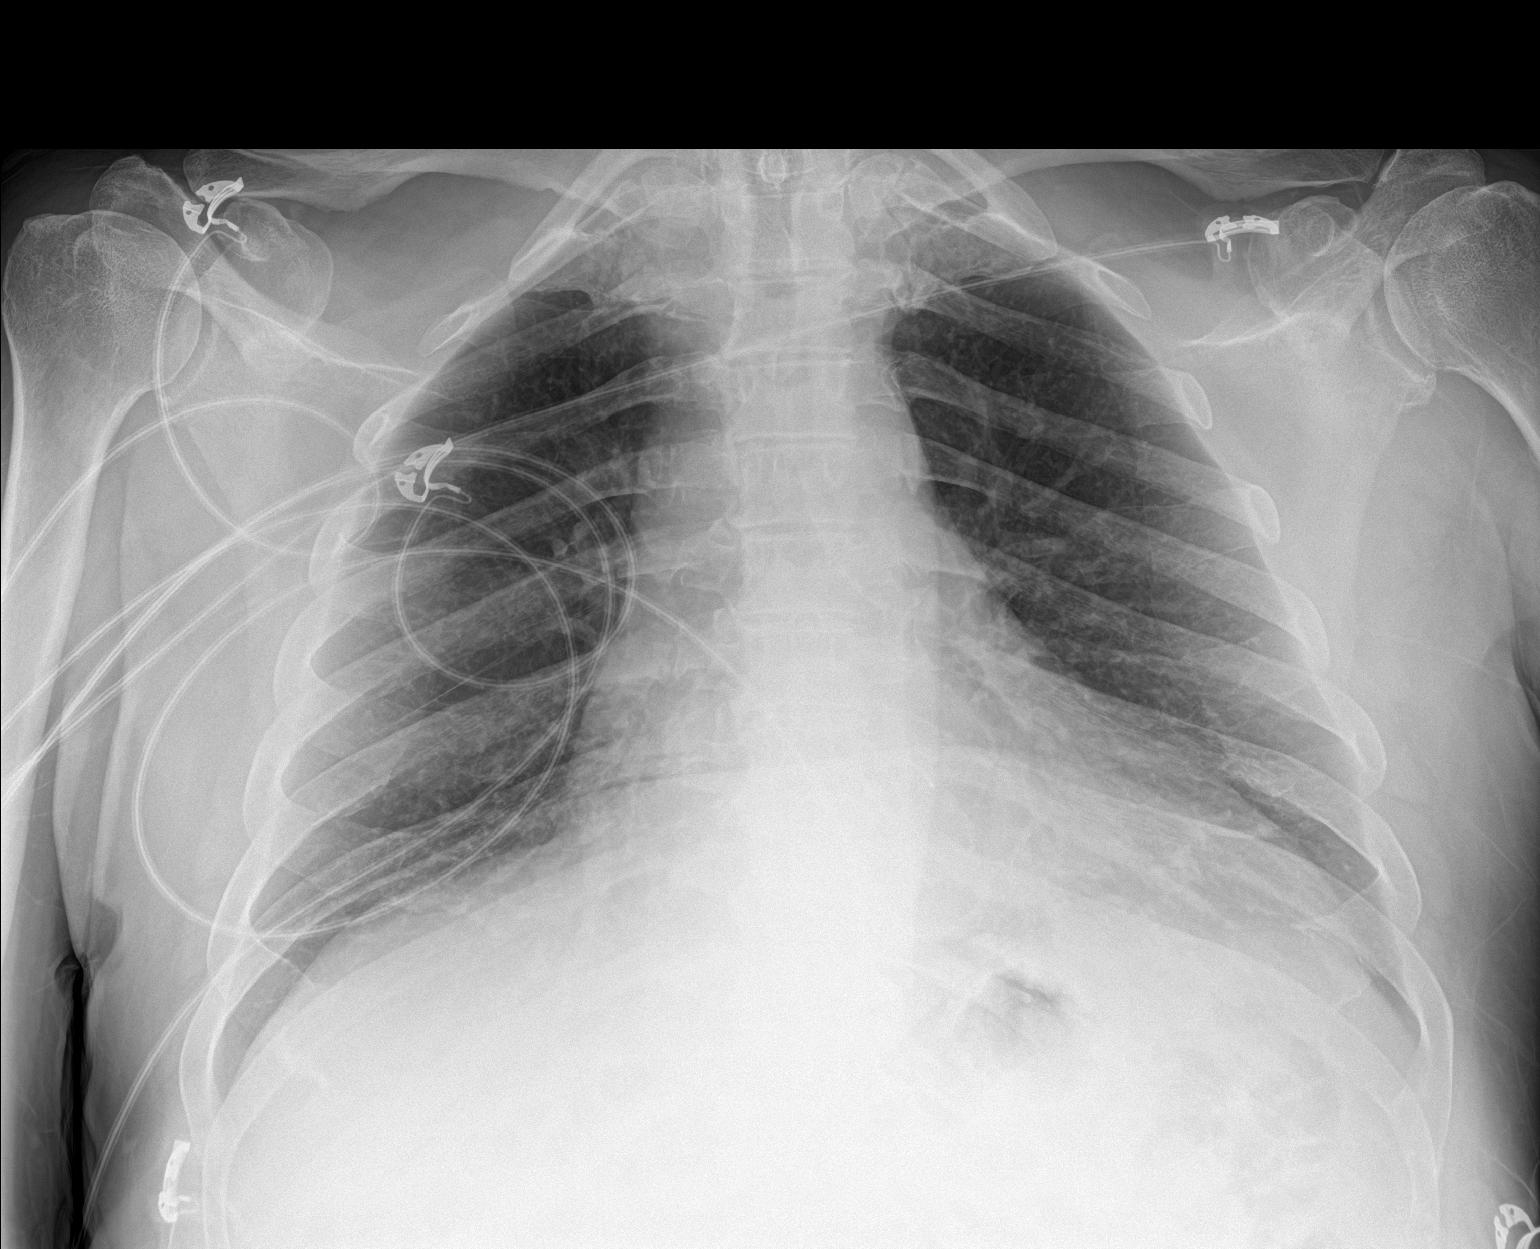

[1 of 1 positions shown; findings below may reference images not displayed]

FINDINGS: Single frontal view of the chest demonstrates an unremarkable
cardiac silhouette. No airspace disease, effusion, or pneumothorax.
No acute displaced fracture.
IMPRESSION: 1. No acute intrathoracic process.

## 2021-06-11 IMAGING — CT CT HEAD W/O CM
3 series · 15 of 47 positions shown, 18 images · non-contrast
Comparison: None.

CLINICAL DATA: Fall down stairs

EXAM:
CT HEAD WITHOUT CONTRAST
CT CERVICAL SPINE WITHOUT CONTRAST
TECHNIQUE: Multidetector CT imaging of the head and cervical spine was
performed following the standard protocol without intravenous
contrast. Multiplanar CT image reconstructions of the cervical spine
were also generated.

[Series 3: head 5.0 h30s · axial · 0.46mm/px · z∈[-161,-16]mm · 9 of 35 slices shown, 12 images]
[im 3/35  brain]
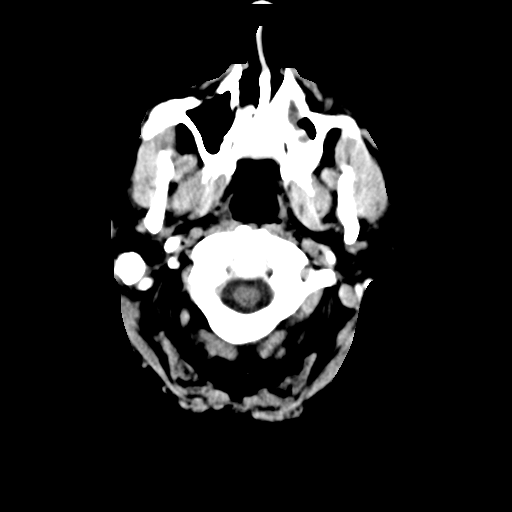
[im 3/35  bone]
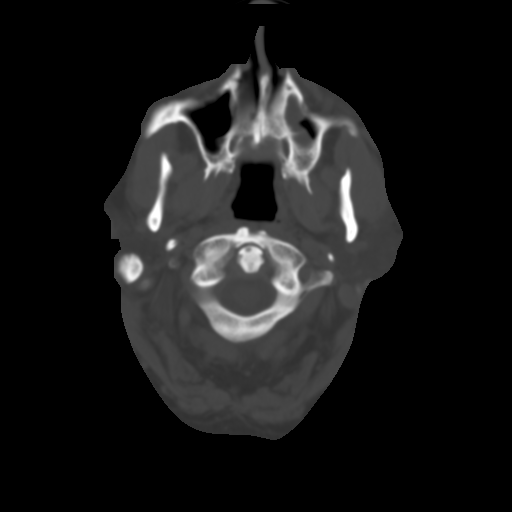
[im 6/35  brain]
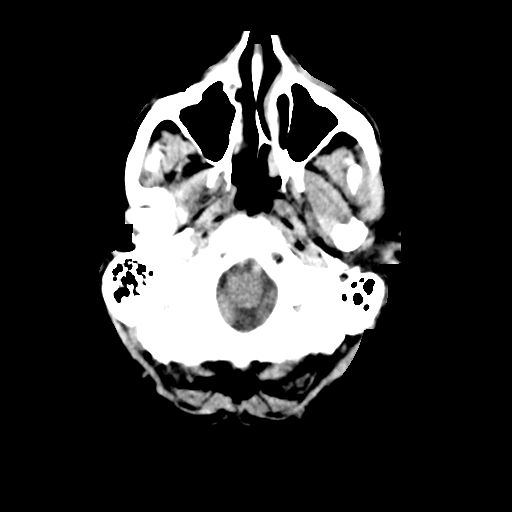
[im 10/35  brain]
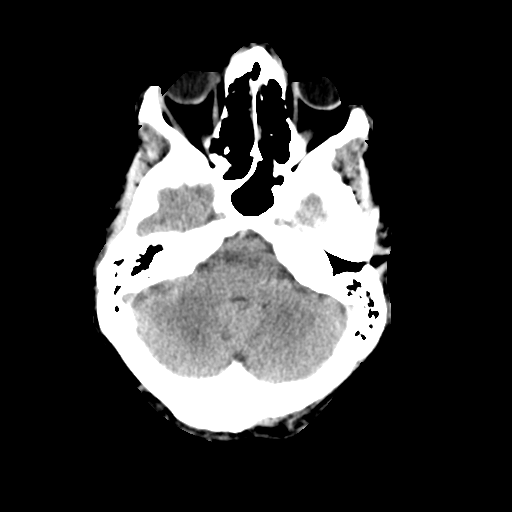
[im 13/35  brain]
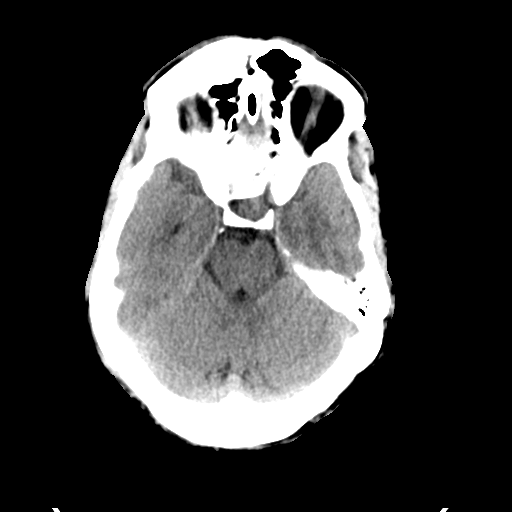
[im 18/35  brain]
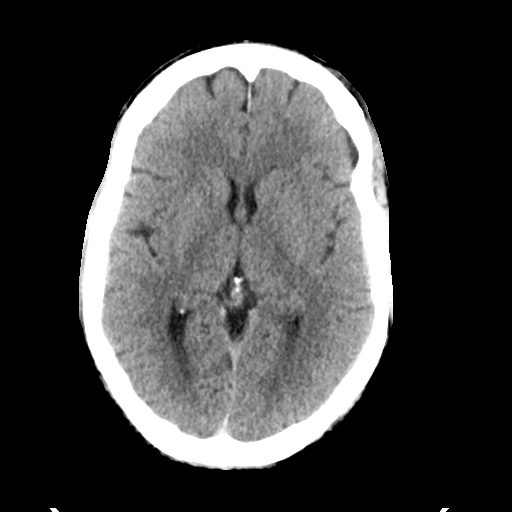
[im 18/35  bone]
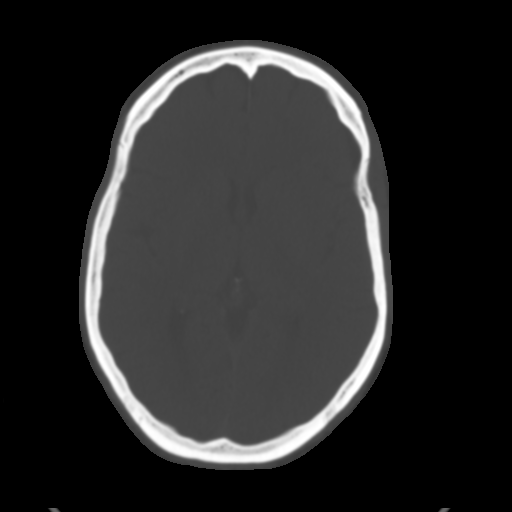
[im 22/35  brain]
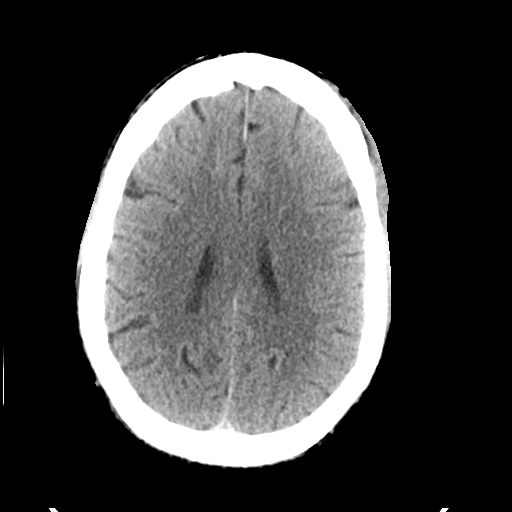
[im 25/35  brain]
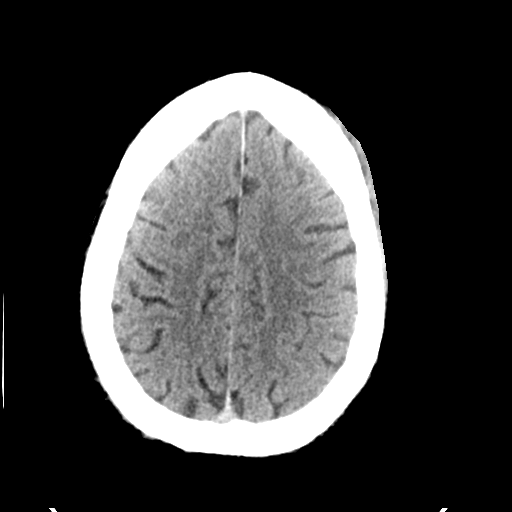
[im 29/35  brain]
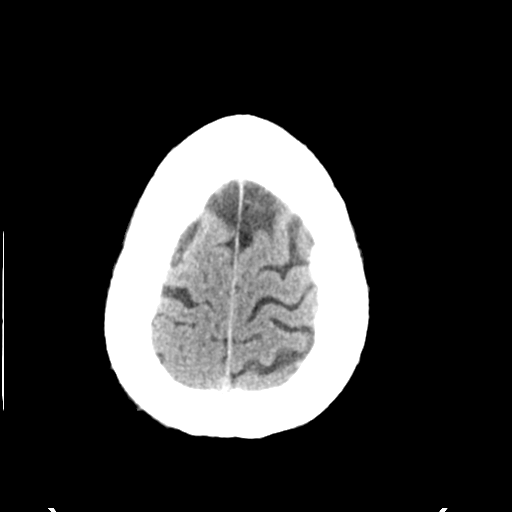
[im 32/35  brain]
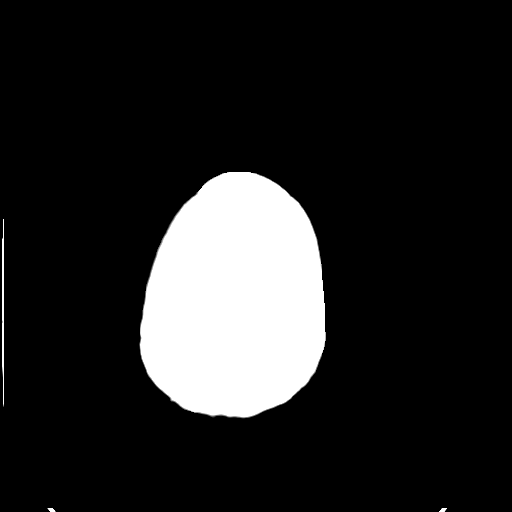
[im 32/35  bone]
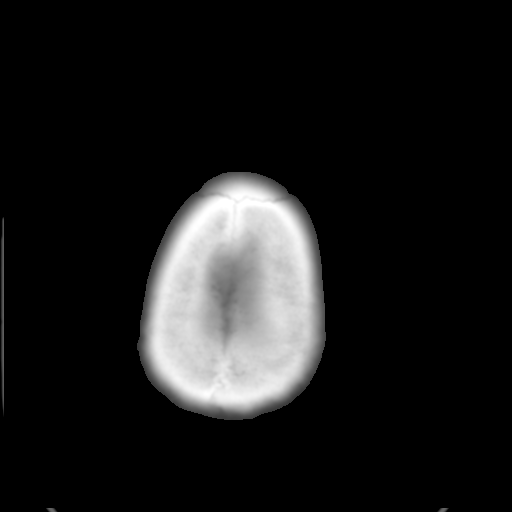

[Series 5: head 3.0 mpr cor · coronal · 0.35mm/px · 3 of 75 slices shown]
[im 25/75  brain]
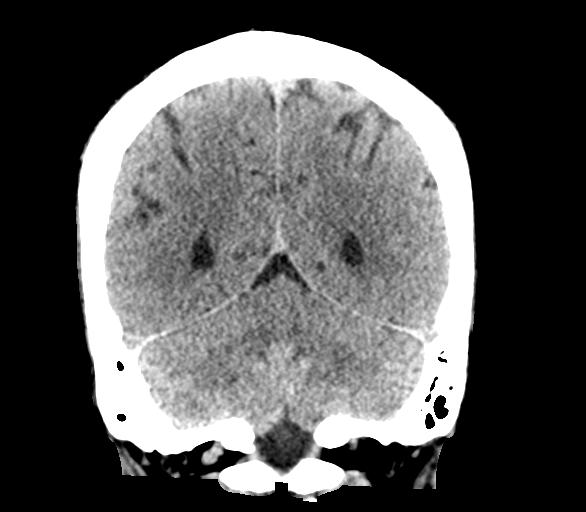
[im 33/75  brain]
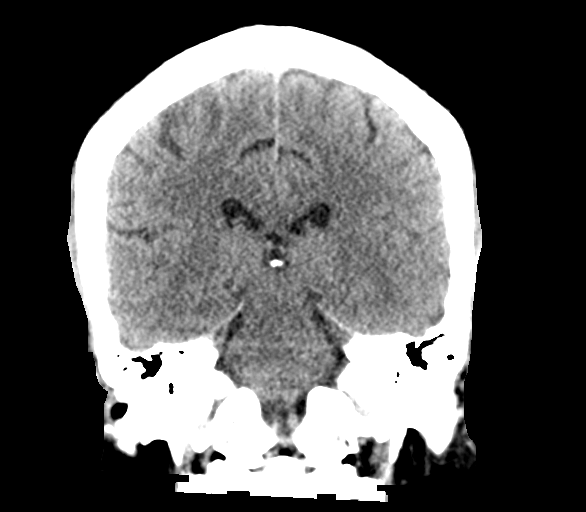
[im 42/75  brain]
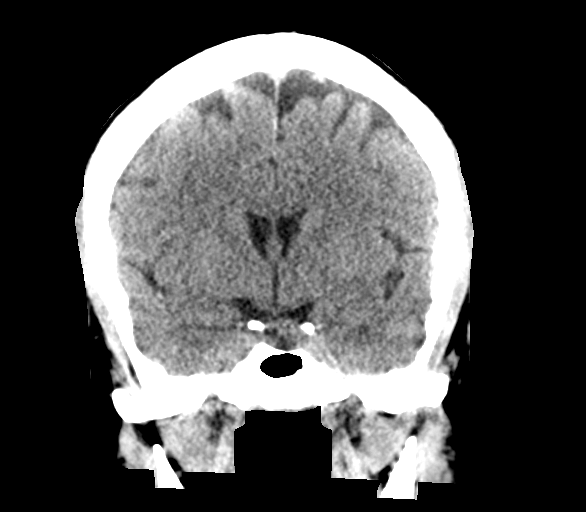

[Series 6: head 3.0 mpr sag · sagittal · 0.38mm/px · 3 of 64 slices shown]
[im 22/64  brain]
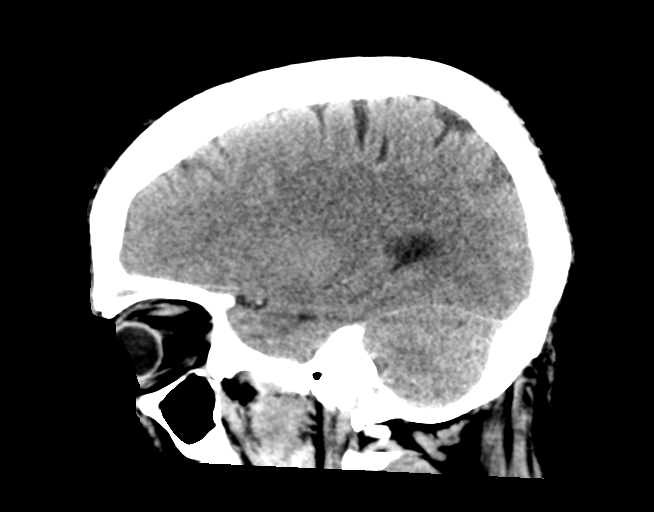
[im 32/64  brain]
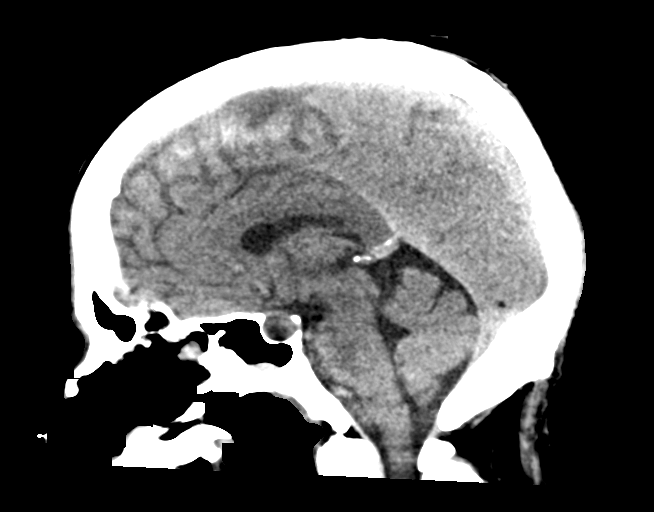
[im 43/64  brain]
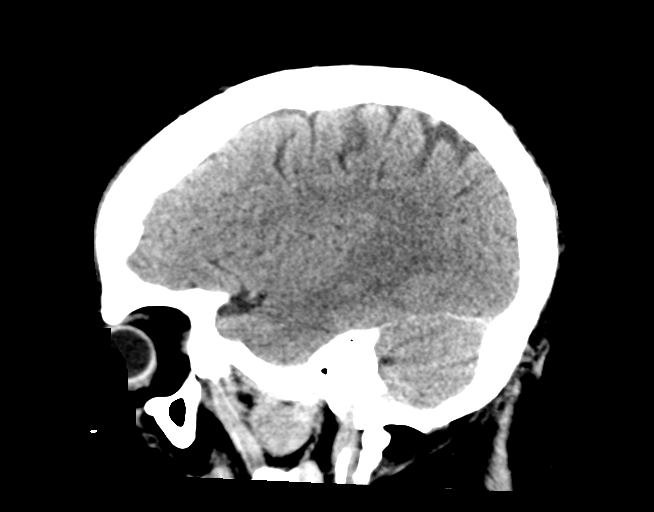

[15 of 47 positions shown; findings below may reference images not displayed]

FINDINGS: CT HEAD FINDINGS

Brain: There is no mass, hemorrhage or extra-axial collection. The
size and configuration of the ventricles and extra-axial CSF spaces
are normal. The brain parenchyma is normal, without evidence of
acute or chronic infarction.

Vascular: No abnormal hyperdensity of the major intracranial
arteries or dural venous sinuses. No intracranial atherosclerosis.

Skull: Left frontal scalp hematoma.  No skull fracture.

Sinuses/Orbits: Right frontal sinus osteoma.  The orbits are normal.

CT CERVICAL SPINE FINDINGS

Alignment: No static subluxation. Facets are aligned. Occipital
condyles are normally positioned.

Skull base and vertebrae: No acute fracture.

Soft tissues and spinal canal: No prevertebral fluid or swelling. No
visible canal hematoma.

Disc levels: Multilevel cervical degenerative disc disease with some
degree of spinal canal narrowing at C4-5, C5-6 and T1-2.

Upper chest: No pneumothorax, pulmonary nodule or pleural effusion.

Other: Normal visualized paraspinal cervical soft tissues.
IMPRESSION: 1. No acute intracranial abnormality.
2. Left frontal scalp hematoma without skull fracture.
3. No acute fracture or static subluxation of the cervical spine.

## 2021-07-31 ENCOUNTER — Encounter: Payer: Self-pay | Admitting: Internal Medicine
